# Patient Record
Sex: Female | Born: 1960 | Race: White | Hispanic: No | Marital: Married | Smoking: Never smoker
Health system: Southern US, Community
[De-identification: ages and names within clinical notes are randomized; demographics above are authoritative.]

## PROBLEM LIST (undated history)

## (undated) DIAGNOSIS — F419 Anxiety disorder, unspecified: Secondary | ICD-10-CM

## (undated) DIAGNOSIS — E039 Hypothyroidism, unspecified: Secondary | ICD-10-CM

## (undated) DIAGNOSIS — J45909 Unspecified asthma, uncomplicated: Secondary | ICD-10-CM

## (undated) DIAGNOSIS — M1990 Unspecified osteoarthritis, unspecified site: Secondary | ICD-10-CM

## (undated) DIAGNOSIS — D649 Anemia, unspecified: Secondary | ICD-10-CM

## (undated) DIAGNOSIS — T7840XA Allergy, unspecified, initial encounter: Secondary | ICD-10-CM

## (undated) DIAGNOSIS — I10 Essential (primary) hypertension: Secondary | ICD-10-CM

## (undated) HISTORY — DX: Unspecified osteoarthritis, unspecified site: M19.90

## (undated) HISTORY — DX: Anxiety disorder, unspecified: F41.9

## (undated) HISTORY — DX: Allergy, unspecified, initial encounter: T78.40XA

## (undated) HISTORY — DX: Essential (primary) hypertension: I10

## (undated) HISTORY — PX: BREAST BIOPSY: SHX20

## (undated) HISTORY — PX: NASAL SINUS SURGERY: SHX719

## (undated) HISTORY — PX: ANTERIOR CRUCIATE LIGAMENT REPAIR: SHX115

## (undated) HISTORY — PX: BUNIONECTOMY: SHX129

## (undated) HISTORY — DX: Unspecified asthma, uncomplicated: J45.909

## (undated) HISTORY — DX: Hypothyroidism, unspecified: E03.9

## (undated) HISTORY — PX: MENISCUS REPAIR: SHX5179

## (undated) HISTORY — DX: Anemia, unspecified: D64.9

## (undated) HISTORY — PX: TOTAL VAGINAL HYSTERECTOMY: SHX2548

---

## 1999-01-28 ENCOUNTER — Ambulatory Visit (HOSPITAL_COMMUNITY): Admission: RE | Admit: 1999-01-28 | Discharge: 1999-01-28 | Payer: Self-pay | Admitting: *Deleted

## 1999-02-05 ENCOUNTER — Ambulatory Visit (HOSPITAL_COMMUNITY): Admission: RE | Admit: 1999-02-05 | Discharge: 1999-02-05 | Payer: Self-pay | Admitting: *Deleted

## 1999-02-05 ENCOUNTER — Encounter: Payer: Self-pay | Admitting: *Deleted

## 2001-01-20 ENCOUNTER — Other Ambulatory Visit: Admission: RE | Admit: 2001-01-20 | Discharge: 2001-01-20 | Payer: Self-pay | Admitting: *Deleted

## 2004-10-06 ENCOUNTER — Ambulatory Visit: Payer: Self-pay

## 2005-01-14 ENCOUNTER — Ambulatory Visit: Payer: Self-pay | Admitting: Internal Medicine

## 2005-03-01 ENCOUNTER — Ambulatory Visit: Payer: Self-pay | Admitting: Unknown Physician Specialty

## 2005-09-21 ENCOUNTER — Ambulatory Visit: Payer: Self-pay

## 2006-03-30 ENCOUNTER — Ambulatory Visit: Payer: Self-pay | Admitting: Otolaryngology

## 2006-12-27 ENCOUNTER — Ambulatory Visit: Payer: Self-pay | Admitting: Internal Medicine

## 2007-07-04 ENCOUNTER — Ambulatory Visit: Payer: Self-pay | Admitting: Unknown Physician Specialty

## 2007-12-15 ENCOUNTER — Ambulatory Visit: Payer: Self-pay | Admitting: Gastroenterology

## 2007-12-28 ENCOUNTER — Ambulatory Visit: Payer: Self-pay | Admitting: Gastroenterology

## 2008-01-25 ENCOUNTER — Ambulatory Visit: Payer: Self-pay | Admitting: Unknown Physician Specialty

## 2008-04-04 ENCOUNTER — Ambulatory Visit: Payer: Self-pay | Admitting: Internal Medicine

## 2008-05-29 ENCOUNTER — Ambulatory Visit: Payer: Self-pay | Admitting: Unknown Physician Specialty

## 2008-06-11 ENCOUNTER — Inpatient Hospital Stay: Payer: Self-pay | Admitting: Unknown Physician Specialty

## 2008-08-05 ENCOUNTER — Encounter: Payer: Self-pay | Admitting: Maternal & Fetal Medicine

## 2008-08-06 ENCOUNTER — Encounter: Payer: Self-pay | Admitting: Maternal & Fetal Medicine

## 2009-12-14 ENCOUNTER — Emergency Department: Payer: Self-pay | Admitting: Emergency Medicine

## 2010-04-02 ENCOUNTER — Ambulatory Visit: Payer: Self-pay | Admitting: Unknown Physician Specialty

## 2011-10-14 ENCOUNTER — Ambulatory Visit: Payer: Self-pay | Admitting: Unknown Physician Specialty

## 2012-10-18 ENCOUNTER — Ambulatory Visit: Payer: Self-pay | Admitting: Unknown Physician Specialty

## 2012-12-18 ENCOUNTER — Encounter: Payer: Self-pay | Admitting: Gastroenterology

## 2013-10-19 ENCOUNTER — Ambulatory Visit: Payer: Self-pay | Admitting: Unknown Physician Specialty

## 2014-03-25 ENCOUNTER — Ambulatory Visit: Payer: Self-pay | Admitting: Cardiovascular Disease

## 2014-03-25 ENCOUNTER — Encounter (INDEPENDENT_AMBULATORY_CARE_PROVIDER_SITE_OTHER): Payer: Self-pay

## 2014-03-25 ENCOUNTER — Encounter: Payer: Self-pay | Admitting: Cardiovascular Disease

## 2014-03-25 ENCOUNTER — Ambulatory Visit (INDEPENDENT_AMBULATORY_CARE_PROVIDER_SITE_OTHER): Payer: No Typology Code available for payment source | Admitting: Cardiovascular Disease

## 2014-03-25 ENCOUNTER — Telehealth: Payer: Self-pay | Admitting: *Deleted

## 2014-03-25 VITALS — BP 138/84 | HR 70 | Ht 63.0 in | Wt 137.5 lb

## 2014-03-25 DIAGNOSIS — H9201 Otalgia, right ear: Secondary | ICD-10-CM | POA: Insufficient documentation

## 2014-03-25 DIAGNOSIS — R079 Chest pain, unspecified: Secondary | ICD-10-CM

## 2014-03-25 DIAGNOSIS — M542 Cervicalgia: Secondary | ICD-10-CM

## 2014-03-25 DIAGNOSIS — H9209 Otalgia, unspecified ear: Secondary | ICD-10-CM

## 2014-03-25 LAB — TROPONIN I

## 2014-03-25 LAB — D-DIMER(ARMC): D-Dimer: 363 ng/ml

## 2014-03-25 LAB — SEDIMENTATION RATE: ERYTHROCYTE SED RATE: 4 mm/h (ref 0–30)

## 2014-03-25 NOTE — Assessment & Plan Note (Signed)
The chest pain is overall atypical and likely musculoskeletal. However, there is a pleuritic component. The features are not anginal.  Physical exam is normal and baseline ECG does not show any acute changes. I requested labs today that include troponin level, D. dimer and sedimentation rate to exclude other causes of pleuritic chest pain. I also requested a chest x-ray. If these come back unremarkable, I don't think further workup is needed unless symptoms persist.

## 2014-03-25 NOTE — Assessment & Plan Note (Signed)
I don't think this is related to the chest pain. This has been associated with dizziness. I advised her to followup with ENT.

## 2014-03-25 NOTE — Patient Instructions (Signed)
Your physician recommends that you return for lab work in:  Please go straight to [facility] following your visit for STAT labs:  Troponin  Sed Rate  D Dimer   Please have a chest x ray as well   Your physician recommends that you schedule a follow-up appointment in:  As needed

## 2014-03-25 NOTE — Telephone Encounter (Signed)
Called patient to inform her that per Dr. Kirke CorinArida her labs and CXR are normal  Patient verbalized understanding

## 2014-03-25 NOTE — Progress Notes (Signed)
HPI   This is a very pleasant 53 year old female who is here for evaluation of chest pain. She has no previous cardiac history and no significant chronic medical conditions. She has known history of hypothyroidism on replacement therapy and previous knee surgery. She is a lifelong nonsmoker. Father had myocardial infarction at the age of 51 but otherwise there is no family history of premature coronary artery disease. Yesterday, she had an intense episode of lower substernal chest pain radiating to the back. It was sharp in nature and lasted for a few minutes. It happened after she was carrying some books and then sat down. The chest pain got worse with breathing. There was no heartburn or pressure feeling. No palpitations, syncope or presyncope. The chest continued to be sore but very mildly after that. She used a lidocaine patch and ibuprofen with significant improvement in symptoms. She has been having right-sided neck and ear pain recently but that was not associated with the chest pain. She also noticed recent dizziness and balance problems. She reports being under stress lately.  No Known Allergies   No current outpatient prescriptions on file prior to visit.   No current facility-administered medications on file prior to visit.     Past Medical History  Diagnosis Date  . Thyroid disease   . Hypothyroidism   . Hypertension   . Asthma      Past Surgical History  Procedure Laterality Date  . Total vaginal hysterectomy    . Knee surgery Left   . Bunionectomy    . Nasal sinus surgery       Family History  Problem Relation Age of Onset  . Colon cancer Mother   . Breast cancer Mother   . Heart attack Father   . Arrhythmia Father      History   Social History  . Marital Status: Married    Spouse Name: N/A    Number of Children: N/A  . Years of Education: N/A   Occupational History  . Not on file.   Social History Main Topics  . Smoking status: Never Smoker   .  Smokeless tobacco: Not on file  . Alcohol Use: Yes     Comment: socially  . Drug Use: No  . Sexual Activity: Not on file   Other Topics Concern  . Not on file   Social History Narrative  . No narrative on file     ROS A 10 point review of system was performed. It is negative other than that mentioned in the history of present illness.   PHYSICAL EXAM   BP 138/84  Pulse 70  Ht 5\' 3"  (1.6 m)  Wt 137 lb 8 oz (62.37 kg)  BMI 24.36 kg/m2 Constitutional: She is oriented to person, place, and time. She appears well-developed and well-nourished. No distress.  HENT: No nasal discharge.  Head: Normocephalic and atraumatic.  Eyes: Pupils are equal and round. No discharge.  Neck: Normal range of motion. Neck supple. No JVD present. No thyromegaly present.  Cardiovascular: Normal rate, regular rhythm, normal heart sounds. Exam reveals no gallop and no friction rub. No murmur heard.  Pulmonary/Chest: Effort normal and breath sounds normal. No stridor. No respiratory distress. She has no wheezes. She has no rales. She exhibits no tenderness.  Abdominal: Soft. Bowel sounds are normal. She exhibits no distension. There is no tenderness. There is no rebound and no guarding.  Musculoskeletal: Normal range of motion. She exhibits no edema and no tenderness.  Neurological:  She is alert and oriented to person, place, and time. Coordination normal.  Skin: Skin is warm and dry. No rash noted. She is not diaphoretic. No erythema. No pallor.  Psychiatric: She has a normal mood and affect. Her behavior is normal. Judgment and thought content normal.     EKG: Normal sinus rhythm with no significant ST or T wave changes.   ASSESSMENT AND PLAN

## 2014-03-26 ENCOUNTER — Encounter: Payer: Self-pay | Admitting: Cardiovascular Disease

## 2014-03-26 ENCOUNTER — Other Ambulatory Visit: Payer: Self-pay

## 2014-03-26 DIAGNOSIS — R079 Chest pain, unspecified: Secondary | ICD-10-CM

## 2015-02-03 ENCOUNTER — Ambulatory Visit: Payer: Self-pay | Admitting: Family Medicine

## 2015-04-09 ENCOUNTER — Ambulatory Visit (INDEPENDENT_AMBULATORY_CARE_PROVIDER_SITE_OTHER): Payer: No Typology Code available for payment source | Admitting: Podiatry

## 2015-04-09 ENCOUNTER — Encounter: Payer: Self-pay | Admitting: Podiatry

## 2015-04-09 VITALS — BP 138/85 | HR 68 | Resp 16 | Ht 63.0 in | Wt 132.0 lb

## 2015-04-09 DIAGNOSIS — Q828 Other specified congenital malformations of skin: Secondary | ICD-10-CM | POA: Diagnosis not present

## 2015-04-09 DIAGNOSIS — B079 Viral wart, unspecified: Secondary | ICD-10-CM

## 2015-04-09 MED ORDER — FLUOROURACIL 5 % EX CREA: TOPICAL_CREAM | Freq: Two times a day (BID) | CUTANEOUS | Status: DC

## 2015-04-09 NOTE — Progress Notes (Signed)
   Subjective:    Patient ID: Katherine Braun, female    DOB: 1961/01/29, 54 y.o.   MRN: [id number]  HPI  Painful lesion on the bottom of the right foot, real painful when walking . 2nd met   Review of Systems  All other systems reviewed and are negative.      Objective:   Physical Exam: I have reviewed her past mental history medications allergies surgery social history and review of systems. Pulses are palpable bilateral. Hallux valgus is present bilateral. Plantar flexed elongated second metatarsal resulting in a ridge of porokeratotic lesions to the plantar medial aspect second metatarsal head right foot. 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 Verruca plantaris sub-first metatarsophalangeal joint right foot.  Plan: Debridement of reactive hyperkeratoses today. Placed padding. I also wrote a prescription for Efudex cream to be applied twice daily under occlusion and I will follow-up with her in 6 weeks.

## 2015-05-21 ENCOUNTER — Ambulatory Visit: Payer: No Typology Code available for payment source | Admitting: Podiatry

## 2015-11-11 ENCOUNTER — Encounter: Payer: Self-pay | Admitting: Gastroenterology

## 2017-06-28 ENCOUNTER — Other Ambulatory Visit: Payer: Self-pay | Admitting: Family Medicine

## 2017-06-28 DIAGNOSIS — Z1231 Encounter for screening mammogram for malignant neoplasm of breast: Secondary | ICD-10-CM

## 2017-07-11 ENCOUNTER — Encounter (HOSPITAL_COMMUNITY): Payer: Self-pay

## 2017-07-11 ENCOUNTER — Ambulatory Visit
Admission: RE | Admit: 2017-07-11 | Discharge: 2017-07-11 | Disposition: A | Discharge status: Home / Self Care | Payer: 59 | Source: Ambulatory Visit | Attending: Family Medicine | Admitting: Family Medicine

## 2017-07-11 DIAGNOSIS — Z1231 Encounter for screening mammogram for malignant neoplasm of breast: Secondary | ICD-10-CM | POA: Diagnosis present

## 2017-09-14 ENCOUNTER — Other Ambulatory Visit: Payer: Self-pay | Admitting: Unknown Physician Specialty

## 2017-09-14 ENCOUNTER — Ambulatory Visit
Admission: RE | Admit: 2017-09-14 | Discharge: 2017-09-14 | Disposition: A | Discharge status: Home / Self Care | Payer: 59 | Source: Ambulatory Visit | Attending: Unknown Physician Specialty | Admitting: Unknown Physician Specialty

## 2017-09-14 DIAGNOSIS — R140 Abdominal distension (gaseous): Secondary | ICD-10-CM

## 2017-09-14 DIAGNOSIS — R142 Eructation: Secondary | ICD-10-CM

## 2017-09-14 DIAGNOSIS — R1013 Epigastric pain: Secondary | ICD-10-CM

## 2017-09-14 DIAGNOSIS — R110 Nausea: Secondary | ICD-10-CM

## 2017-09-14 DIAGNOSIS — R1010 Upper abdominal pain, unspecified: Secondary | ICD-10-CM | POA: Insufficient documentation

## 2018-01-11 ENCOUNTER — Encounter: Payer: Self-pay | Admitting: Gastroenterology

## 2018-02-24 ENCOUNTER — Other Ambulatory Visit: Payer: Self-pay

## 2018-02-24 DIAGNOSIS — R079 Chest pain, unspecified: Secondary | ICD-10-CM

## 2018-03-20 ENCOUNTER — Ambulatory Visit (INDEPENDENT_AMBULATORY_CARE_PROVIDER_SITE_OTHER)
Admission: RE | Admit: 2018-03-20 | Discharge: 2018-03-20 | Disposition: A | Discharge status: Home / Self Care | Payer: BLUE CROSS/BLUE SHIELD | Source: Ambulatory Visit | Attending: Cardiovascular Disease | Admitting: Cardiovascular Disease

## 2018-03-20 DIAGNOSIS — R079 Chest pain, unspecified: Secondary | ICD-10-CM

## 2018-07-31 ENCOUNTER — Other Ambulatory Visit: Payer: Self-pay | Admitting: Family Medicine

## 2018-07-31 ENCOUNTER — Encounter: Payer: Self-pay | Admitting: Gastroenterology

## 2018-07-31 DIAGNOSIS — Z1231 Encounter for screening mammogram for malignant neoplasm of breast: Secondary | ICD-10-CM

## 2018-08-15 ENCOUNTER — Ambulatory Visit
Admission: RE | Admit: 2018-08-15 | Discharge: 2018-08-15 | Disposition: A | Discharge status: Home / Self Care | Payer: BLUE CROSS/BLUE SHIELD | Source: Ambulatory Visit | Attending: Family Medicine | Admitting: Family Medicine

## 2018-08-15 DIAGNOSIS — Z1231 Encounter for screening mammogram for malignant neoplasm of breast: Secondary | ICD-10-CM | POA: Diagnosis not present

## 2018-08-21 ENCOUNTER — Other Ambulatory Visit: Payer: Self-pay | Admitting: Family Medicine

## 2018-08-21 DIAGNOSIS — R928 Other abnormal and inconclusive findings on diagnostic imaging of breast: Secondary | ICD-10-CM

## 2018-08-21 DIAGNOSIS — N6489 Other specified disorders of breast: Secondary | ICD-10-CM

## 2018-08-25 ENCOUNTER — Ambulatory Visit
Admission: RE | Admit: 2018-08-25 | Discharge: 2018-08-25 | Disposition: A | Discharge status: Home / Self Care | Payer: BLUE CROSS/BLUE SHIELD | Source: Ambulatory Visit | Attending: Family Medicine | Admitting: Family Medicine

## 2018-08-25 DIAGNOSIS — N6489 Other specified disorders of breast: Secondary | ICD-10-CM

## 2018-08-25 DIAGNOSIS — R928 Other abnormal and inconclusive findings on diagnostic imaging of breast: Secondary | ICD-10-CM | POA: Diagnosis present

## 2018-09-15 ENCOUNTER — Ambulatory Visit (INDEPENDENT_AMBULATORY_CARE_PROVIDER_SITE_OTHER): Payer: BLUE CROSS/BLUE SHIELD | Admitting: Gastroenterology

## 2018-09-15 ENCOUNTER — Encounter (INDEPENDENT_AMBULATORY_CARE_PROVIDER_SITE_OTHER): Payer: Self-pay

## 2018-09-15 ENCOUNTER — Encounter: Payer: Self-pay | Admitting: Gastroenterology

## 2018-09-15 VITALS — BP 116/82 | HR 80 | Ht 63.0 in | Wt 141.0 lb

## 2018-09-15 DIAGNOSIS — R194 Change in bowel habit: Secondary | ICD-10-CM

## 2018-09-15 DIAGNOSIS — R1032 Left lower quadrant pain: Secondary | ICD-10-CM | POA: Diagnosis not present

## 2018-09-15 DIAGNOSIS — Z800 Family history of malignant neoplasm of digestive organs: Secondary | ICD-10-CM | POA: Diagnosis not present

## 2018-09-15 MED ORDER — PEG-KCL-NACL-NASULF-NA ASC-C 140 G PO SOLR: 140.0000 g | ORAL | 0 refills | Status: DC

## 2018-09-15 MED ORDER — METOCLOPRAMIDE HCL 5 MG PO TABS: ORAL_TABLET | ORAL | 0 refills | Status: DC

## 2018-09-15 NOTE — Progress Notes (Signed)
[facility] Consult Note:  History: Katherine Braun 09/15/2018  Referring physician: Kandyce Rud, MD  Reason for consult/chief complaint: Diarrhea (started this summer ) and Abdominal Pain (lower abd pain right before a BM)   Subjective  HPI:  This is a very pleasant 57 year old woman referred by primary care for chronic diarrhea.  It started during a trip to [place] in the spring, and she saw primary care for it on 05/16/2018.  There was a plan for stool study, but [name] says that when she started the dicyclomine they prescribed, the diarrhea abated so she did not do the stool studies.  She was also having severe anxiety at the time which has lately been better on Effexor.  She had previously been taking magnesium for chronic constipation but has long since stopped that.  Occasionally she will have a sharp left lower quadrant pain before bowel movement in the stools were watery at times.  That has all abated in the last couple of months and now she will vary between constipation, normal stool and occasional loose or soft stool. She denies nocturnal diarrhea, and had 1 or 2 episodes where she was not able to get to the bathroom on time when there was urgency.  That has not occurred for the last couple of months and she still takes the dicyclomine as needed. She has a family history of colon cancer in her mother and paternal grandfather, and also an aunt has had colon cancer and breast cancer [name] her last had a colonoscopy with his clinic in January 2009, and it was only notable for diverticulosis.   ROS:  Review of Systems  Constitutional: Negative for appetite change and unexpected weight change.  HENT: Negative for mouth sores and voice change.   Eyes: Negative for pain and redness.  Respiratory: Negative for cough and shortness of breath.   Cardiovascular: Negative for chest pain and palpitations.  Genitourinary: Negative for dysuria and hematuria.    Musculoskeletal: Positive for arthralgias and back pain. Negative for myalgias.  Skin: Negative for pallor and rash.  Neurological: Negative for weakness and headaches.  Hematological: Negative for adenopathy.  Psychiatric/Behavioral:       Chronic anxiety     Past Medical History: Past Medical History:  Diagnosis Date  . Anemia   . Anxiety   . Arthritis   . Asthma   . Hypertension   . Hypothyroidism      Past Surgical History: Past Surgical History:  Procedure Laterality Date  . ANTERIOR CRUCIATE LIGAMENT REPAIR Left   . BUNIONECTOMY Left   . MENISCUS REPAIR Left   . NASAL SINUS SURGERY    . TOTAL VAGINAL HYSTERECTOMY       Family History: Family History  Problem Relation Age of Onset  . Colon cancer Mother   . Breast cancer Mother 30  . Diabetes Mother   . Kidney disease Mother   . Heart attack Father   . Arrhythmia Father   . Colon polyps Father   . Diverticulitis Father   . Colon cancer Paternal Grandfather   . Breast cancer Maternal Aunt   . Colon cancer Maternal Aunt   . Lung cancer Maternal Aunt     Social History: Social History   Socioeconomic History  . Marital status: Married    Spouse name: Not on file  . Number of children: 2  . Years of education: Not on file  . Highest education level: Not on file  Occupational History  .  Occupation: homemaker  Social Needs  . Financial resource strain: Not on file  . Food insecurity:    Worry: Not on file    Inability: Not on file  . Transportation needs:    Medical: Not on file    Non-medical: Not on file  Tobacco Use  . Smoking status: Never Smoker  . Smokeless tobacco: Never Used  Substance and Sexual Activity  . Alcohol use: Yes    Alcohol/week: 0.0 standard drinks    Comment: socially  . Drug use: No  . Sexual activity: Not on file  Lifestyle  . Physical activity:    Days per week: Not on file    Minutes per session: Not on file  . Stress: Not on file  Relationships  . Social  connections:    Talks on phone: Not on file    Gets together: Not on file    Attends religious service: Not on file    Active member of club or organization: Not on file    Attends meetings of clubs or organizations: Not on file    Relationship status: Not on file  Other Topics Concern  . Not on file  Social History Narrative  . Not on file    Allergies: Allergies  Allergen Reactions  . Tylenol [Acetaminophen]     Flu like sx's     Outpatient Meds: Current Outpatient Medications  Medication Sig Dispense Refill  . B Complex Vitamins (VITAMIN-B COMPLEX PO) Take by mouth daily.    . Cholecalciferol (VITAMIN D-3 PO) Take by mouth daily.    Marland Kitchen DEVILS CLAW PO Take 1 tablet by mouth daily.    Marland Kitchen dicyclomine (BENTYL) 20 MG tablet Take 20 mg by mouth as needed for spasms.    Marland Kitchen ELDERBERRY PO Take 2 tablets by mouth daily.    . Glucosamine HCl (GLUCOSAMINE PO) Take by mouth daily.    Marland Kitchen MAGNESIUM PO Take by mouth daily.    . Melatonin 5 MG CAPS Take 2 capsules by mouth at bedtime as needed.    . Multiple Vitamins-Minerals (HAIR VITAMINS PO) Take 1 tablet by mouth daily.    . multivitamin-lutein (OCUVITE-LUTEIN) CAPS capsule Take 1 capsule by mouth daily.    . Omega-3 Fatty Acids (FISH OIL PO) Take by mouth 2 (two) times daily.    Marland Kitchen thyroid (ARMOUR) 60 MG tablet Take 180 mg by mouth as directed.    . TURMERIC PO Take 1 tablet by mouth as needed.    . venlafaxine XR (EFFEXOR-XR) 37.5 MG 24 hr capsule Take 37.5 mg by mouth daily with breakfast.    . metoCLOPramide (REGLAN) 5 MG tablet Take 1 tablet one hour prior to drinking Plenvu bowel prep 2 tablet 0  . PEG-KCl-NaCl-NaSulf-Na Asc-C (PLENVU) 140 g SOLR Take 140 g by mouth as directed. 1 each 0   No current facility-administered medications for this visit.       ___________________________________________________________________ Objective   Exam:  BP 116/82 (BP Location: Left Arm, Patient Position: Sitting, Cuff Size: Normal)    Pulse 80   Ht 5\' 3"  (1.6 m) Comment: height measured without shoes  Wt 141 lb (64 kg)   BMI 24.98 kg/m    General: this is a(n) well-appearing woman  Eyes: sclera anicteric, no redness  ENT: oral mucosa moist without lesions, no cervical or supraclavicular lymphadenopathy, good dentition  CV: RRR without murmur, S1/S2, no JVD, no peripheral edema  Resp: clear to auscultation bilaterally, normal RR and effort noted  GI:  soft, no tenderness, with active bowel sounds. No guarding or palpable organomegaly noted.  Skin; warm and dry, no rash or jaundice noted  Neuro: awake, alert and oriented x 3. Normal gross motor function and fluent speech  Labs:  nml LFTS/TSH, Albumin June 2019  Radiologic Studies:  None  Assessment: Encounter Diagnoses  Name Primary?  . Change in bowel habit Yes  . Family history of colon cancer   . LLQ abdominal pain     She seems to have developed IBS-like symptoms in the setting of worsened chronic anxiety.  IBD or microscopic colitis or less likely considerations.  Doubtful new onset celiac. She is due for a colonoscopy for family history of colon cancer.  Plan:  Screening colonoscopy due to family history  Biopsies can be taken at that time to rule out microscopic colitis Continue as needed use of dicyclomine  Thank you for the courtesy of this consult.  Please call me with any questions or concerns.  Charlie Pitter III  CC: Kandyce Rud, MD

## 2018-09-15 NOTE — Patient Instructions (Signed)
If you are age 55 or older, your body mass index should be between 23-30. Your Body mass index is 24.98 kg/m. If this is out of the aforementioned range listed, please consider follow up with your Primary Care Provider.  If you are age 87 or younger, your body mass index should be between 19-25. Your Body mass index is 24.98 kg/m. If this is out of the aformentioned range listed, please consider follow up with your Primary Care Provider.   You have been scheduled for a colonoscopy. Please follow written instructions given to you at your visit today.  Please pick up your prep supplies at the pharmacy within the next 1-3 days. If you use inhalers (even only as needed), please bring them with you on the day of your procedure. Your physician has requested that you go to www.startemmi.com and enter the access code given to you at your visit today. This web site gives a general overview about your procedure. However, you should still follow specific instructions given to you by our office regarding your preparation for the procedure.  It was a pleasure to see you today!  Dr. Myrtie Neither

## 2018-09-21 ENCOUNTER — Encounter: Payer: Self-pay | Admitting: Gastroenterology

## 2018-09-25 ENCOUNTER — Encounter: Payer: Self-pay | Admitting: Gastroenterology

## 2018-10-03 ENCOUNTER — Encounter: Payer: Self-pay | Admitting: Gastroenterology

## 2018-10-03 ENCOUNTER — Ambulatory Visit (AMBULATORY_SURGERY_CENTER): Payer: BLUE CROSS/BLUE SHIELD | Admitting: Gastroenterology

## 2018-10-03 VITALS — BP 133/83 | HR 58 | Temp 97.7°F | Resp 16 | Ht 63.0 in | Wt 141.0 lb

## 2018-10-03 DIAGNOSIS — Z1211 Encounter for screening for malignant neoplasm of colon: Secondary | ICD-10-CM

## 2018-10-03 DIAGNOSIS — K5730 Diverticulosis of large intestine without perforation or abscess without bleeding: Secondary | ICD-10-CM | POA: Diagnosis not present

## 2018-10-03 DIAGNOSIS — K529 Noninfective gastroenteritis and colitis, unspecified: Secondary | ICD-10-CM

## 2018-10-03 DIAGNOSIS — Z800 Family history of malignant neoplasm of digestive organs: Secondary | ICD-10-CM

## 2018-10-03 MED ORDER — SODIUM CHLORIDE 0.9 % IV SOLN: 500.0000 mL | Freq: Once | INTRAVENOUS | Status: DC

## 2018-10-03 NOTE — Progress Notes (Signed)
Called to room to assist during endoscopic procedure.  Patient ID and intended procedure confirmed with present staff. Received instructions for my participation in the procedure from the performing physician.  

## 2018-10-03 NOTE — Patient Instructions (Signed)
YOU HAD AN ENDOSCOPIC PROCEDURE TODAY AT THE [facility]:   Refer to the procedure report that was given to you for any specific questions about what was found during the examination.  If the procedure report does not answer your questions, please call your gastroenterologist to clarify.  If you requested that your care partner not be given the details of your procedure findings, then the procedure report has been included in a sealed envelope for you to review at your convenience later.  YOU SHOULD EXPECT: Some feelings of bloating in the abdomen. Passage of more gas than usual.  Walking can help get rid of the air that was put into your GI tract during the procedure and reduce the bloating. If you had a lower endoscopy (such as a colonoscopy or flexible sigmoidoscopy) you may notice spotting of blood in your stool or on the toilet paper. If you underwent a bowel prep for your procedure, you may not have a normal bowel movement for a few days.  Please Note:  You might notice some irritation and congestion in your nose or some drainage.  This is from the oxygen used during your procedure.  There is no need for concern and it should clear up in a day or so.  SYMPTOMS TO REPORT IMMEDIATELY:   Following lower endoscopy (colonoscopy or flexible sigmoidoscopy):  Excessive amounts of blood in the stool  Significant tenderness or worsening of abdominal pains  Swelling of the abdomen that is new, acute  Fever of 100F or higher  Please see handouts given to you on Diverticulosis.  For urgent or emergent issues, a gastroenterologist can be reached at any hour by calling [phone].   DIET:  We do recommend a small meal at first, but then you may proceed to your regular diet.  Drink plenty of fluids but you should avoid alcoholic beverages for 24 hours.  ACTIVITY:  You should plan to take it easy for the rest of today and you should NOT DRIVE or use heavy machinery until tomorrow  (because of the sedation medicines used during the test).    FOLLOW UP: Our staff will call the number listed on your records the next business day following your procedure to check on you and address any questions or concerns that you may have regarding the information given to you following your procedure. If we do not reach you, we will leave a message.  However, if you are feeling well and you are not experiencing any problems, there is no need to return our call.  We will assume that you have returned to your regular daily activities without incident.  If any biopsies were taken you will be contacted by phone or by letter within the next 1-3 weeks.  Please call us at [phone] if you have not heard about the biopsies in 3 weeks.    SIGNATURES/CONFIDENTIALITY: You and/or your care partner have signed paperwork which will be entered into your electronic medical record.  These signatures attest to the fact that that the information above on your After Visit Summary has been reviewed and is understood.  Full responsibility of the confidentiality of this discharge information lies with you and/or your care-partner.  Thank you for letting us take care of your healthcare needs today. 

## 2018-10-03 NOTE — Op Note (Signed)
[facility] Patient Name: Katherine Braun Procedure Date: 10/03/2018 9:37 AM MRN: [id number] Endoscopist: Sherilyn Cooter L. Myrtie Neither , MD Age: 57 Referring MD:  Date of Birth: [birth year] Gender: Female Account #: [id number] Procedure:                Colonoscopy Indications:              Colon cancer screening in patient at increased                            risk: Colorectal cancer in mother (also maternal                            aunt and paternal grandfather) Medicines:                Monitored Anesthesia Care Procedure:                Pre-Anesthesia Assessment:                           - Prior to the procedure, a History and Physical                            was performed, and patient medications and                            allergies were reviewed. The patient's tolerance of                            previous anesthesia was also reviewed. The risks                            and benefits of the procedure and the sedation                            options and risks were discussed with the patient.                            All questions were answered, and informed consent                            was obtained. Prior Anticoagulants: The patient has                            taken no previous anticoagulant or antiplatelet                            agents. ASA Grade Assessment: II - A patient with                            mild systemic disease. After reviewing the risks                            and benefits, the patient was deemed in  satisfactory condition to undergo the procedure.                           After obtaining informed consent, the colonoscope                            was passed under direct vision. Throughout the                            procedure, the patient's blood pressure, pulse, and                            oxygen saturations were monitored continuously. The                            Colonoscope was  introduced through the anus and                            advanced to the the cecum, identified by                            appendiceal orifice and ileocecal valve. The                            colonoscopy was performed without difficulty. The                            patient tolerated the procedure well. The quality                            of the bowel preparation was excellent. The                            ileocecal valve, appendiceal orifice, and rectum                            were photographed. The quality of the bowel                            preparation was evaluated using the BBPS [facility]                            Bowel Preparation Scale) with scores of: Right                            Colon = 3, Transverse Colon = 3 and Left Colon = 3                            (entire mucosa seen well with no residual staining,                            small fragments of stool or opaque liquid). The  total BBPS score equals 9. The bowel preparation                            used was Plenvu. Scope In: 9:49:42 AM Scope Out: 10:02:47 AM Scope Withdrawal Time: 0 hours 9 minutes 7 seconds  Total Procedure Duration: 0 hours 13 minutes 5 seconds  Findings:                 The perianal and digital rectal examinations were                            normal.                           Diverticula were found from the transverse colon to                            the sigmoid colon.                           Normal mucosa was found in the entire colon.                            Biopsies for histology were taken with a cold                            forceps from the right colon and left colon for                            evaluation of microscopic colitis due to reported                            chronic diarrhea.                           There is no endoscopic evidence of polyps in the                            entire colon.                           The  exam was otherwise without abnormality on                            direct and retroflexion views. Complications:            No immediate complications. Estimated Blood Loss:     Estimated blood loss was minimal. Impression:               - Diverticulosis in the left colon.                           - Normal mucosa in the entire examined colon.                            Biopsied.                           -  The examination was otherwise normal on direct                            and retroflexion views. Recommendation:           - Patient has a contact number available for                            emergencies. The signs and symptoms of potential                            delayed complications were discussed with the                            patient. Return to normal activities tomorrow.                            Written discharge instructions were provided to the                            patient.                           - Resume previous diet.                           - Continue present medications.                           - Await pathology results.                           - Repeat colonoscopy in 5 years for screening                            purposes. Dayshia Ballinas L. Myrtie Neither, MD 10/03/2018 10:08:57 AM This report has been signed electronically.

## 2018-10-03 NOTE — Progress Notes (Signed)
Spontaneous respirations throughout. VSS. Resting comfortably. To PACU on room air. Report to  RN. 

## 2018-10-04 ENCOUNTER — Telehealth: Payer: Self-pay

## 2018-10-04 NOTE — Telephone Encounter (Signed)
  Follow up Call-  Call back number 10/03/2018  Post procedure Call Back phone  # [phone]  Permission to leave phone message Yes  Some recent data might be hidden     Patient questions:  Do you have a fever, pain , or abdominal swelling? No. Pain Score  0 *  Have you tolerated food without any problems? Yes.    Have you been able to return to your normal activities? Yes.    Do you have any questions about your discharge instructions: Diet   No. Medications  No. Follow up visit  No.  Do you have questions or concerns about your Care? No.  Actions: * If pain score is 4 or above: No action needed, pain <4.

## 2018-10-11 ENCOUNTER — Encounter: Payer: BLUE CROSS/BLUE SHIELD | Admitting: Gastroenterology

## 2018-11-21 NOTE — Telephone Encounter (Signed)
Error

## 2019-08-20 ENCOUNTER — Encounter: Payer: Self-pay | Admitting: Occupational Therapy

## 2019-08-20 ENCOUNTER — Other Ambulatory Visit: Payer: Self-pay

## 2019-08-20 ENCOUNTER — Ambulatory Visit: Payer: BC Managed Care – PPO | Attending: Rheumatology | Admitting: Occupational Therapy

## 2019-08-20 DIAGNOSIS — M79642 Pain in left hand: Secondary | ICD-10-CM | POA: Insufficient documentation

## 2019-08-20 DIAGNOSIS — M79641 Pain in right hand: Secondary | ICD-10-CM | POA: Diagnosis not present

## 2019-08-20 NOTE — Patient Instructions (Signed)
Pt to do moist heat in the am for stiffness  Gentle tendon glides  Opposition  Pain free 10 reps Evening to decrease pain   Joint protection and AE hand outs review and provided

## 2019-08-20 NOTE — Therapy (Signed)
[facility] [address], [place], [address] Phone: [phone]   Fax:  [phone]  Occupational Therapy Evaluation  Patient Details  Name: Katherine Braun MRN: [id number] Date of Birth: 1961-09-21 Referring Provider (OT): Jefm Bryant   Encounter Date: 08/20/2019  OT End of Session - 08/20/19 1211    Visit Number  1    Number of Visits  4    Date for OT Re-Evaluation  09/17/19    OT Start Time  1050    OT Stop Time  1155    OT Time Calculation (min)  65 min    Activity Tolerance  Patient tolerated treatment well    Behavior During Therapy  [facility] for tasks assessed/performed       Past Medical History:  Diagnosis Date  . Allergy   . Anemia   . Anxiety   . Arthritis   . Asthma   . Hypertension   . Hypothyroidism     Past Surgical History:  Procedure Laterality Date  . ANTERIOR CRUCIATE LIGAMENT REPAIR Left   . BUNIONECTOMY Left   . MENISCUS REPAIR Left   . NASAL SINUS SURGERY    . TOTAL VAGINAL HYSTERECTOMY      There were no vitals filed for this visit.  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, golf , yardwork sewing, crochet,       Strength   Right Hand Grip (lbs)  50   pain 8/10 pain free 20   Right Hand Lateral Pinch  17 lbs   pain   Right Hand 3 Point Pinch  15 lbs   pain   Left Hand Grip (lbs)  63   pain 8/10 - pain free 25   Left Hand Lateral Pinch  15 lbs   pain   Left Hand 3 Point Pinch  16 lbs   pain     Right Hand AROM   R Thumb Radial ABduction/ADduction 0-55  48    R Thumb Palmar ABduction/ADduction 0-45  55    R Thumb Opposition to Index  --   pain to WNL   R Index  MCP 0-90  85 Degrees    R Index PIP 0-100  100 Degrees    R Long  MCP 0-90  90 Degrees    R Long PIP 0-100  100 Degrees    R Ring  MCP 0-90  90 Degrees    R Ring PIP 0-100  100 Degrees    R Little  MCP 0-90  90 Degrees    R Little PIP 0-100  100 Degrees      Left Hand AROM   L Thumb Radial ADduction/ABduction 0-55  45    L Thumb Palmar ADduction/ABduction 0-45  50    L Thumb Opposition to Index  --   Oppposition WNL but pain    L Index  MCP 0-90  90 Degrees    L Index PIP 0-100  100 Degrees    L Long  MCP 0-90  90 Degrees    L Long PIP 0-100  100 Degrees    L Ring  MCP 0-90  90 Degrees    L Ring PIP 0-100  100 Degrees    L Little  MCP 0-90  90 Degrees    L Little PIP 0-100  100 Degrees         Paraffin done to bilateral hands  Prior to review of home program to decrease pain   Fitted with bilateral CMC neoprene splints to use with activities to decrease pain   Pt to do moist heat in the am for stiffness  Gentle tendon glides  Opposition  Pain free 10 reps Evening to decrease  pain   Joint protection and AE hand outs review in length  and provided            OT Education - 08/20/19 1211    Education Details  Findings of eval and Homeprogram    Person(s) Educated  Patient    Methods  Explanation;Demonstration;Tactile cues;Verbal cues;Handout    Comprehension  Returned demonstration;Verbalized understanding       OT Short Term Goals - 08/20/19 1215      OT SHORT TERM GOAL #1   Title  Pt to be independent in homeprogram to modify activities to decrease pain less than 3/10    Baseline  pain increase to 5-8/10 , and not knowledge on homeprogram    Time  2    Period  Weeks    Status  New    Target Date  09/03/19        OT Long Term Goals - 08/20/19 1216      OT LONG TERM GOAL #1   Title  Pt to report 3 joint protectio and AE to decrease pain and increase ease with use on hands    Baseline  no knowledge    Time  4    Period  Weeks    Status  New    Target Date  09/17/19      OT LONG TERM GOAL #2   Title  Pain on PRWHE decrease by more than 20 points    Baseline  pain at eval on PRWHE 39/50    Time  4  Period  Weeks    Status  New    Target Date  09/17/19            Plan - 08/20/19 1212    Clinical Impression Statement  Pt refer to OT /hand therapy for hand pain - CMC thumb pain R worse than L , and 2nd and 3rd digits PIP's - pt pain 5-8/10 on the R hand and 3-5/10 on the L - pt very activie lifestyle and using her hands a lot - pt AROM WNL and strength pain increase to 8/10 but in range for her age - pt in need for modifications of tasks, joint protection and AE - to decrease pain and reduce stress on her hands- fitted with bilateral thumb CMC neoprene splints to use with activities to decrease pain - pt to follow up next week - goal to decrease pain to less than 3/10 with tasks this week    OT Occupational Profile and History  Problem Focused Assessment - Including review of records relating to presenting problem    Occupational  performance deficits (Please refer to evaluation for details):  ADL's;IADL's;Rest and Sleep;Play;Leisure;Social Participation    Body Structure / Function / Physical Skills  ADL;UE functional use;Decreased knowledge of use of DME;Dexterity;Pain;Strength;IADL    Rehab Potential  Fair    Clinical Decision Making  Limited treatment options, no task modification necessary    Comorbidities Affecting Occupational Performance:  None    Modification or Assistance to Complete Evaluation   No modification of tasks or assist necessary to complete eval    OT Frequency  1x / week    OT Duration  4 weeks    OT Treatment/Interventions  Self-care/ADL training;Therapeutic exercise;Patient/family education;Splinting;Paraffin;DME and/or AE instruction;Manual Therapy    Plan  assess progress with modifications and homeprogram    OT Home Exercise Plan  see pt instruction    Consulted and Agree with Plan of Care  Patient       Patient will benefit from skilled therapeutic intervention in order to improve the following deficits and impairments:   Body Structure / Function / Physical Skills: ADL, UE functional use, Decreased knowledge of use of DME, Dexterity, Pain, Strength, IADL       Visit Diagnosis: Pain in right hand - Plan: Ot plan of care cert/re-cert  Pain in left hand - Plan: Ot plan of care cert/re-cert    Problem List Patient Active Problem List   Diagnosis Date Noted  . Chest pain 03/25/2014  . Right ear pain 03/25/2014    Oletta Cohn  OTR/L,CLT 08/20/2019, 12:22 PM  [facility] [address] [place], [address] Phone: [phone]   Fax:  [phone]  Name: NEAVE SHAMES MRN: [id number] Date of Birth: 1960-12-22

## 2019-08-30 ENCOUNTER — Ambulatory Visit: Payer: BC Managed Care – PPO | Admitting: Occupational Therapy

## 2019-12-22 IMAGING — MG MM DIGITAL SCREENING BILAT W/ TOMO W/ CAD
6 of 11 series · 6 of 31 positions shown · non-contrast
Comparison: Previous exam(s).

CLINICAL DATA: Screening.

EXAM:
DIGITAL SCREENING BILATERAL MAMMOGRAM WITH TOMO AND CAD

[R CC synth-2D]
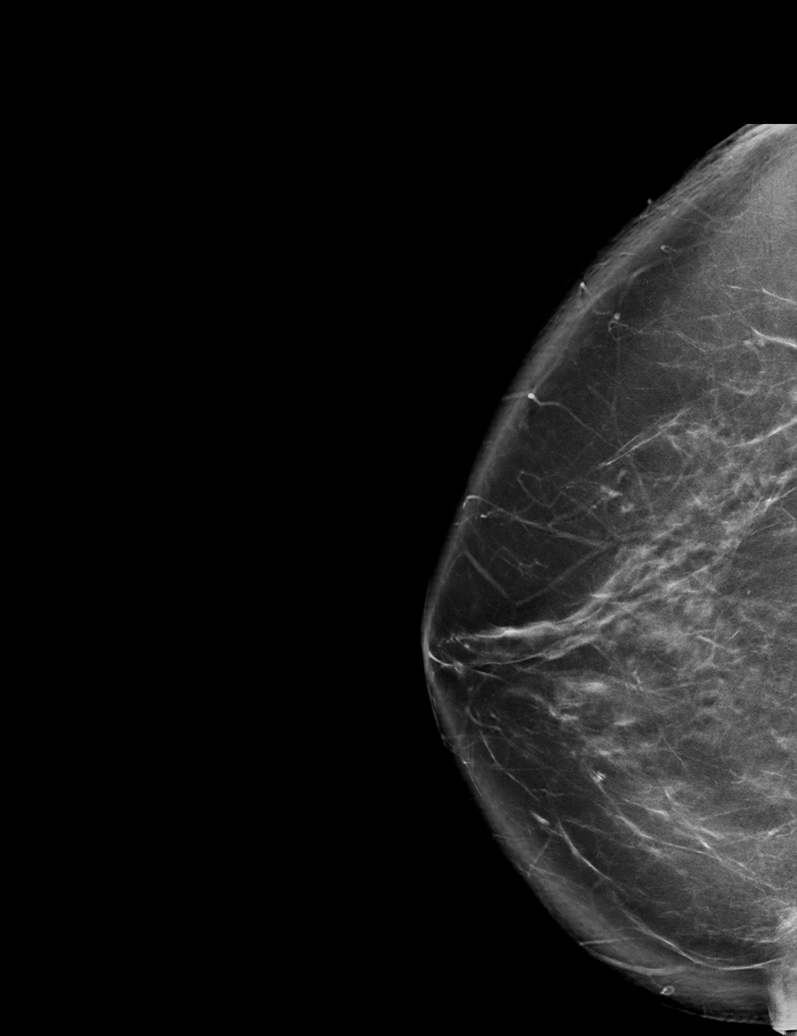

[R MLO synth-2D (1 of 2)]
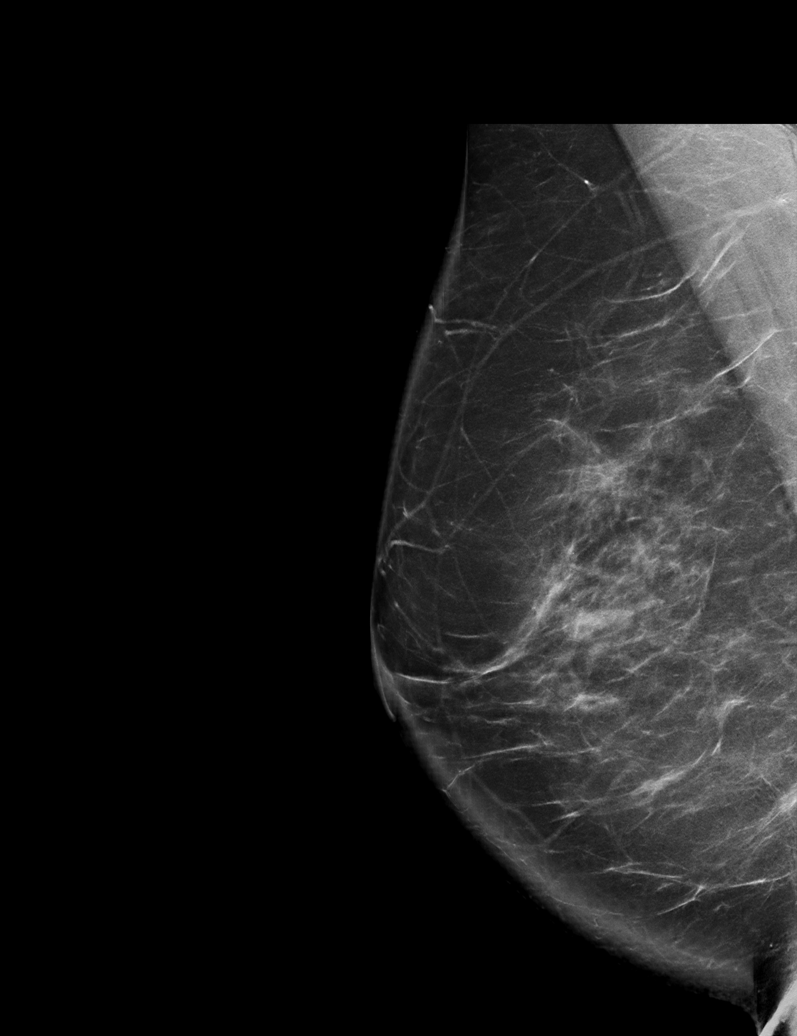

[R MLO synth-2D (2 of 2)]
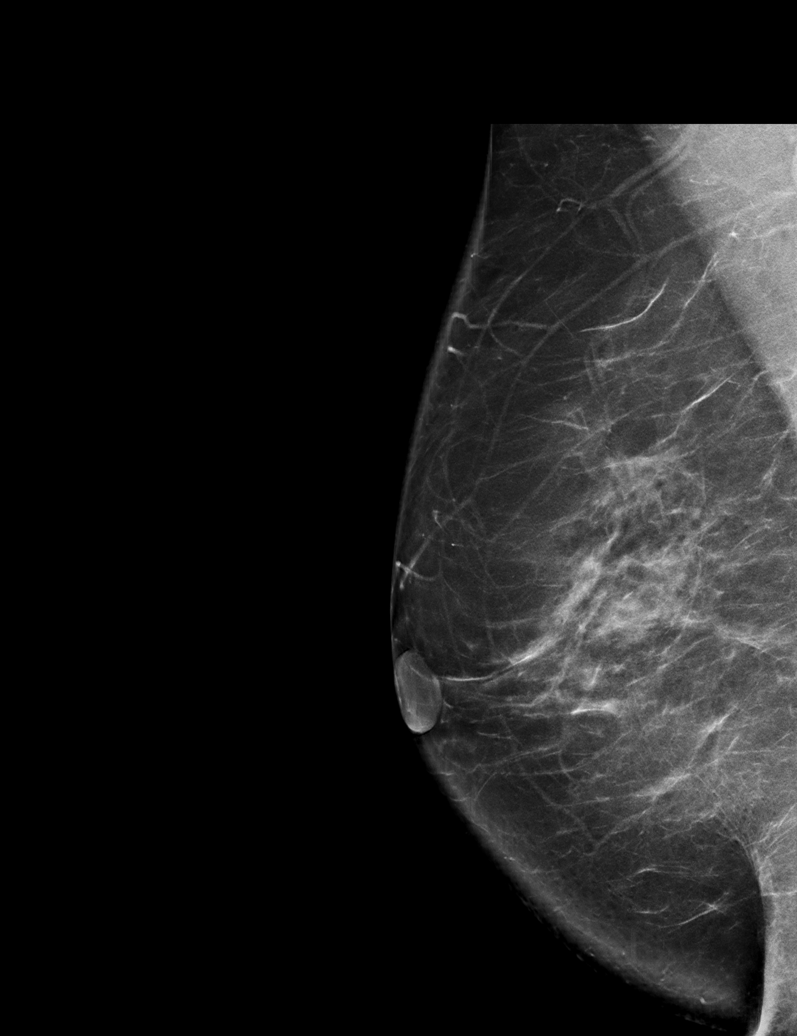

[L CC synth-2D]
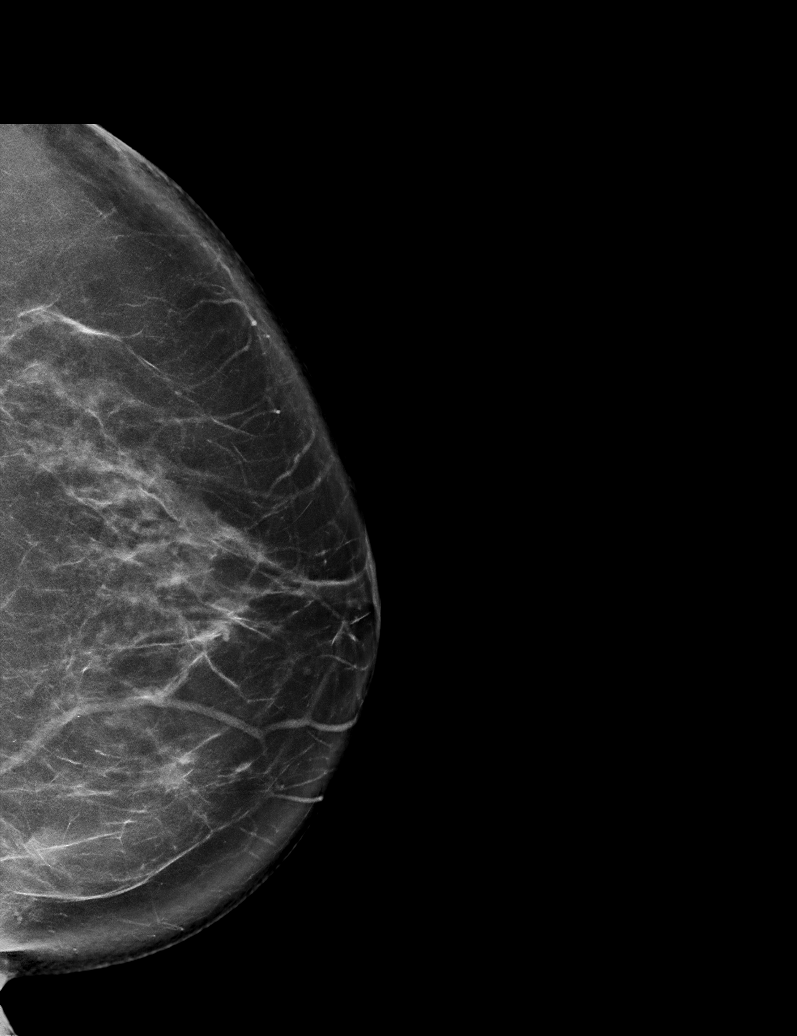

[L MLO synth-2D]
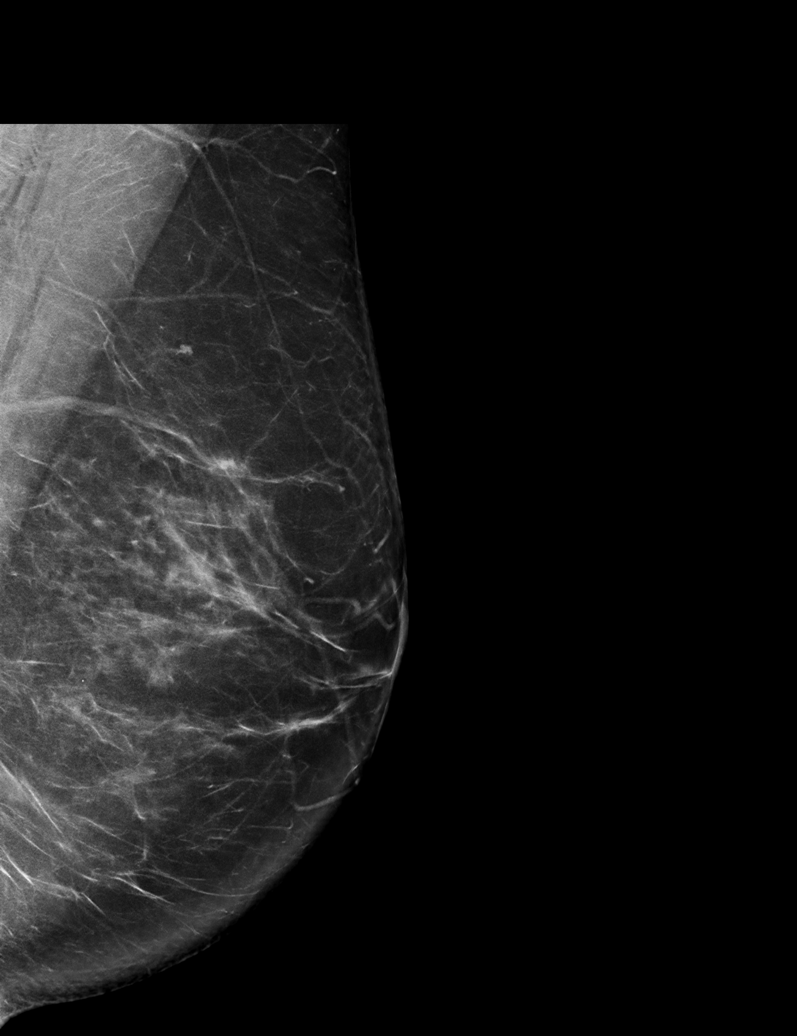

[L CC]
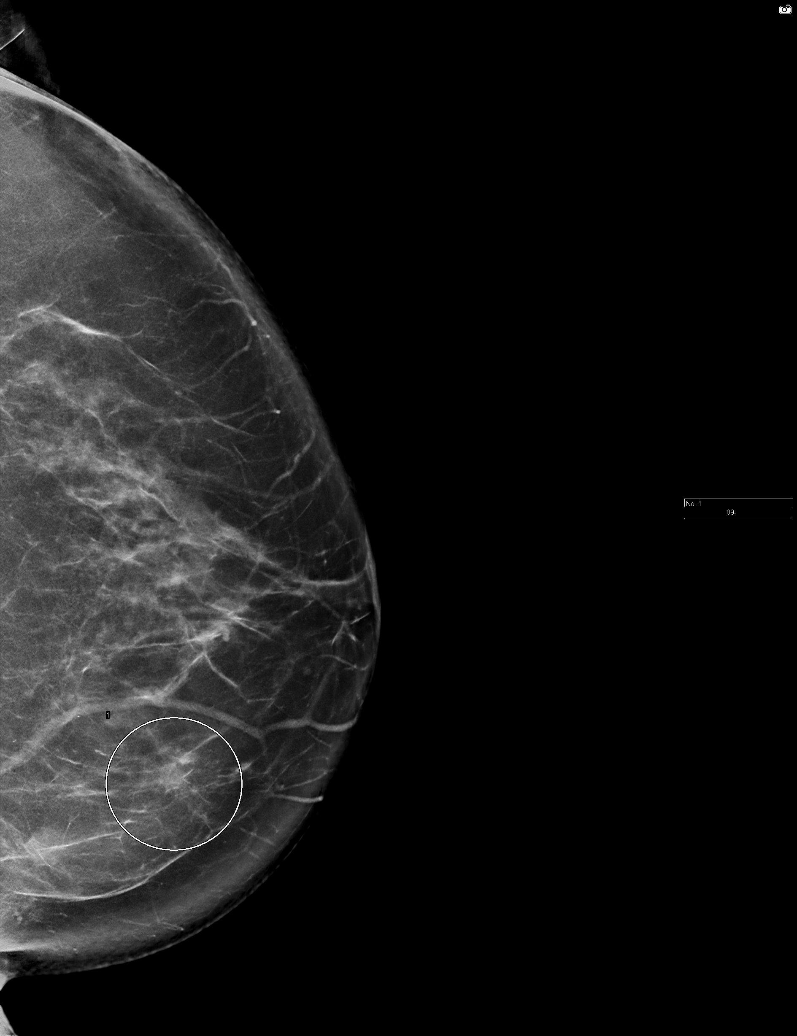

[6 of 31 positions shown; findings below may reference images not displayed]

ACR Breast Density Category b: There are scattered areas of
fibroglandular density.
FINDINGS: In the left breast, a possible asymmetry warrants further
evaluation. In the right breast, no findings suspicious for
malignancy. Images were processed with CAD.
IMPRESSION: Further evaluation is suggested for possible asymmetry in the left
breast.

RECOMMENDATION:
Diagnostic mammogram and possibly ultrasound of the left breast.
(Code:[id number])

The patient will be contacted regarding the findings, and additional
imaging will be scheduled.

BI-RADS CATEGORY  0: Incomplete. Need additional imaging evaluation
and/or prior mammograms for comparison.

## 2020-03-06 ENCOUNTER — Ambulatory Visit: Payer: BC Managed Care – PPO

## 2020-05-20 ENCOUNTER — Ambulatory Visit
Admission: RE | Admit: 2020-05-20 | Discharge: 2020-05-20 | Disposition: A | Discharge status: Home / Self Care | Payer: BC Managed Care – PPO | Source: Ambulatory Visit | Attending: Student | Admitting: Student

## 2020-05-20 ENCOUNTER — Other Ambulatory Visit: Payer: Self-pay | Admitting: Student

## 2020-05-20 DIAGNOSIS — Z1231 Encounter for screening mammogram for malignant neoplasm of breast: Secondary | ICD-10-CM | POA: Diagnosis not present

## 2021-04-27 ENCOUNTER — Other Ambulatory Visit: Payer: Self-pay | Admitting: Student

## 2021-04-27 DIAGNOSIS — Z1231 Encounter for screening mammogram for malignant neoplasm of breast: Secondary | ICD-10-CM

## 2021-05-25 ENCOUNTER — Other Ambulatory Visit: Payer: Self-pay

## 2021-05-25 ENCOUNTER — Ambulatory Visit
Admission: RE | Admit: 2021-05-25 | Discharge: 2021-05-25 | Disposition: A | Discharge status: Home / Self Care | Payer: BC Managed Care – PPO | Source: Ambulatory Visit | Attending: Student | Admitting: Student

## 2021-05-25 DIAGNOSIS — Z1231 Encounter for screening mammogram for malignant neoplasm of breast: Secondary | ICD-10-CM | POA: Diagnosis present

## 2021-05-28 ENCOUNTER — Other Ambulatory Visit: Payer: Self-pay | Admitting: Student

## 2021-05-28 DIAGNOSIS — R928 Other abnormal and inconclusive findings on diagnostic imaging of breast: Secondary | ICD-10-CM

## 2021-06-03 ENCOUNTER — Ambulatory Visit
Admission: RE | Admit: 2021-06-03 | Discharge: 2021-06-03 | Disposition: A | Discharge status: Home / Self Care | Payer: BC Managed Care – PPO | Source: Ambulatory Visit | Attending: Student | Admitting: Student

## 2021-06-03 ENCOUNTER — Other Ambulatory Visit: Payer: Self-pay

## 2021-06-03 DIAGNOSIS — R928 Other abnormal and inconclusive findings on diagnostic imaging of breast: Secondary | ICD-10-CM

## 2021-07-21 ENCOUNTER — Other Ambulatory Visit: Payer: Self-pay | Admitting: Physical Medicine and Rehabilitation

## 2021-07-21 DIAGNOSIS — M5416 Radiculopathy, lumbar region: Secondary | ICD-10-CM

## 2023-04-02 NOTE — Progress Notes (Unsigned)
Cardiology Office Note  Date:  04/04/2023   ID:  SHARNIKA BINNEY, DOB 1961-06-14, MRN [id number]  PCP:  Carren Rang, PA-C   Chief Complaint  Patient presents with   Chest Pain    Patient c/o palpitations, cramping in chest, pounding in chest that radiates into neck and throat and shortness of breath. Medications reviewed by the patient verbally.     HPI:  Ms. Christel Bai is a 62 year old woman with past medical history of Thyroid disease Family history of coronary artery disease/father Hypertension Anxiety Asthma Hyperlipidemia Prediabetes Previously seen by cardiology in 2015 for chest pain Calcium score of 0 in 2019 Who presents by referral from Carren Rang for consultation of her chest discomfort, palpitations  Reports experiencing cramps in chest, rare episodes No rhyme or reason, will come on at rest,  Lasting less than a minute, typically several seconds Not associated with exertion Reports able to exercise on a regular basis  Also having palpitations: random,  Radiating up to throat at times Feels like strong beats  Chronic tired  Recent lab work reviewed Total cholesterol 228 LDL 141  EKG personally reviewed by myself on todays visit Sinus bradycardia rate 59 bpm no significant ST-T wave changes   PMH:   has a past medical history of Allergy, Anemia, Anxiety, Arthritis, Asthma, Hypertension, and Hypothyroidism.  PSH:    Past Surgical History:  Procedure Laterality Date   ANTERIOR CRUCIATE LIGAMENT REPAIR Left    BREAST BIOPSY Left    BUNIONECTOMY Left    MENISCUS REPAIR Left    NASAL SINUS SURGERY     TOTAL VAGINAL HYSTERECTOMY      Current Outpatient Medications  Medication Sig Dispense Refill   albuterol (PROAIR HFA) 108 (90 Base) MCG/ACT inhaler Inhale 2 puffs into the lungs as needed.     B Complex Vitamins (VITAMIN-B COMPLEX PO) Take by mouth daily.     Cholecalciferol (VITAMIN D-3 PO) Take by mouth daily.      dicyclomine (BENTYL) 20 MG tablet Take 20 mg by mouth as needed for spasms.     Glucosamine HCl (GLUCOSAMINE PO) Take by mouth daily.     MAGNESIUM PO Take by mouth daily.     Melatonin 5 MG CAPS Take 2 capsules by mouth at bedtime as needed.     Multiple Vitamins-Minerals (HAIR VITAMINS PO) Take 1 tablet by mouth daily.     multivitamin-lutein (OCUVITE-LUTEIN) CAPS capsule Take 1 capsule by mouth daily.     Omega-3 Fatty Acids (FISH OIL PO) Take by mouth 2 (two) times daily.     thyroid (ARMOUR) 60 MG tablet Take 180 mg by mouth as directed.     venlafaxine XR (EFFEXOR-XR) 37.5 MG 24 hr capsule Take 37.5 mg by mouth daily with breakfast.     No current facility-administered medications for this visit.    Allergies:   Hydrochlorothiazide, Pollen extract, and Acetaminophen   Social History:  The patient  reports that she has never smoked. She has never used smokeless tobacco. She reports current alcohol use. She reports that she does not use drugs.   Family History:   family history includes Arrhythmia in her father; Breast cancer in her maternal aunt; Breast cancer (age of onset: 28) in her mother; Colon cancer in her maternal aunt, mother, and paternal grandfather; Colon polyps in her father; Diabetes in her mother; Diverticulitis in her father; Heart attack in her father; Kidney disease in her mother; Lung cancer in her maternal aunt.  Review of Systems: Review of Systems  Constitutional: Negative.   HENT: Negative.    Respiratory: Negative.    Cardiovascular: Negative.   Gastrointestinal: Negative.   Musculoskeletal: Negative.   Neurological: Negative.   Psychiatric/Behavioral: Negative.    All other systems reviewed and are negative.   PHYSICAL EXAM: VS:  BP 132/80 (BP Location: Right Arm, Patient Position: Sitting, Cuff Size: Normal)   Pulse (!) 59   Ht 5\' 3"  (1.6 m)   Wt 145 lb 4 oz (65.9 kg)   SpO2 95%   BMI 25.73 kg/m  , BMI Body mass index is 25.73 kg/m. GEN: Well  nourished, well developed, in no acute distress HEENT: normal Neck: no JVD, carotid bruits, or masses Cardiac: RRR; no murmurs, rubs, or gallops,no edema  Respiratory:  clear to auscultation bilaterally, normal work of breathing GI: soft, nontender, nondistended, + BS MS: no deformity or atrophy Skin: warm and dry, no rash Neuro:  Strength and sensation are intact Psych: euthymic mood, full affect   Recent Labs: No results found for requested labs within last 365 days.    Lipid Panel No results found for: "CHOL", "HDL", "LDLCALC", "TRIG"    Wt Readings from Last 3 Encounters:  04/04/23 145 lb 4 oz (65.9 kg)  10/03/18 141 lb (64 kg)  09/15/18 141 lb (64 kg)       ASSESSMENT AND PLAN:  Problem List Items Addressed This Visit     Chest pain - Primary   Relevant Orders   EKG 12-Lead   Other Visit Diagnoses     Palpitations       Mixed hyperlipidemia          Chest pain Atypical in nature, around the subxiphoid area, fleeting discomfort, feels like a cramp, coming on at rest Unable to exclude GI etiology, esophageal spasm, hiatal hernia, perhaps less likely gallbladder disease Recommend she try omeprazole 20 mg daily If symptoms persist, could consider CT scan to rule out structural disease/hiatal hernia No further cardiac workup needed at this time given good exercise tolerance  Palpitations Likely having PACs/PVCs Zio monitor offered Prefers not to be on beta-blocker medication he is on as needed or regular basis For worsening symptoms, recommend she consider Zio monitor  Hyperlipidemia Would prefer not to be on medication statin or Zetia Prior calcium scoring approximately 5 years ago with calcium score of 0 Recommend she could consider scanning every 10 years to help guide therapy  Patient seen in consultation for Jacklynn Ganong and will be referred back to her office for ongoing care of the issues detailed above   Signed, Dossie Arbour, M.D., Ph.D. [facility]  [facility] [place], [place] [phone]

## 2023-04-04 ENCOUNTER — Encounter: Payer: Self-pay | Admitting: Cardiovascular Disease

## 2023-04-04 ENCOUNTER — Ambulatory Visit: Payer: BC Managed Care – PPO | Attending: Cardiovascular Disease | Admitting: Cardiovascular Disease

## 2023-04-04 VITALS — BP 132/80 | HR 59 | Ht 63.0 in | Wt 145.2 lb

## 2023-04-04 DIAGNOSIS — R079 Chest pain, unspecified: Secondary | ICD-10-CM

## 2023-04-04 DIAGNOSIS — R002 Palpitations: Secondary | ICD-10-CM | POA: Diagnosis not present

## 2023-04-04 DIAGNOSIS — E782 Mixed hyperlipidemia: Secondary | ICD-10-CM

## 2023-04-04 NOTE — Patient Instructions (Addendum)
Call for zio monitor  Medication Instructions:  No changes  Try OTC omeprazole 20 mg daily for one month  Research Levsin sl  If you need a refill on your cardiac medications before your next appointment, please call your pharmacy.   Lab work: No new labs needed  Testing/Procedures: No new testing needed  Follow-Up: At [facility], you and your health needs are our priority.  As part of our continuing mission to provide you with exceptional heart care, we have created designated Provider Care Teams.  These Care Teams include your primary Cardiologist (physician) and Advanced Practice Providers (APPs -  Physician Assistants and Nurse Practitioners) who all work together to provide you with the care you need, when you need it.  You will need a follow up appointment as needed  Providers on your designated Care Team:   Nicolasa Ducking, NP Eula Listen, PA-C Cadence Fransico Michael, [place]  COVID-19 Vaccine Information can be found at: [email or web address] For questions related to vaccine distribution or appointments, please email [email or web address] or call 4016059890.

## 2023-08-01 ENCOUNTER — Ambulatory Visit
Admission: RE | Admit: 2023-08-01 | Discharge: 2023-08-01 | Disposition: A | Discharge status: Home / Self Care | Payer: BC Managed Care – PPO | Source: Ambulatory Visit | Attending: Student | Admitting: Student

## 2023-08-01 ENCOUNTER — Other Ambulatory Visit: Payer: Self-pay | Admitting: Student

## 2023-08-01 DIAGNOSIS — Z1231 Encounter for screening mammogram for malignant neoplasm of breast: Secondary | ICD-10-CM | POA: Diagnosis present

## 2023-10-24 ENCOUNTER — Encounter: Payer: Self-pay | Admitting: Gastroenterology

## 2023-12-23 ENCOUNTER — Other Ambulatory Visit: Payer: Self-pay | Admitting: Neurology

## 2023-12-23 DIAGNOSIS — G43109 Migraine with aura, not intractable, without status migrainosus: Secondary | ICD-10-CM

## 2023-12-27 ENCOUNTER — Encounter: Payer: Self-pay | Admitting: Neurology

## 2024-01-16 ENCOUNTER — Ambulatory Visit
Admission: RE | Admit: 2024-01-16 | Discharge: 2024-01-16 | Disposition: A | Discharge status: Home / Self Care | Payer: BC Managed Care – PPO | Source: Ambulatory Visit | Attending: Neurology | Admitting: Neurology

## 2024-01-16 DIAGNOSIS — G43109 Migraine with aura, not intractable, without status migrainosus: Secondary | ICD-10-CM

## 2024-01-16 MED ORDER — GADOPICLENOL 0.5 MMOL/ML IV SOLN
7.5000 mL | Freq: Once | INTRAVENOUS | Status: AC | PRN
  Administered 2024-01-16: 7.5 mL via INTRAVENOUS

## 2024-02-22 ENCOUNTER — Other Ambulatory Visit: Payer: Self-pay | Admitting: Orthopedic Surgery

## 2024-02-22 ENCOUNTER — Ambulatory Visit: Attending: Internal Medicine

## 2024-02-22 DIAGNOSIS — M79661 Pain in right lower leg: Secondary | ICD-10-CM

## 2024-02-22 DIAGNOSIS — M7989 Other specified soft tissue disorders: Secondary | ICD-10-CM

## 2024-09-05 ENCOUNTER — Other Ambulatory Visit: Payer: Self-pay | Admitting: Student

## 2024-09-05 DIAGNOSIS — Z1231 Encounter for screening mammogram for malignant neoplasm of breast: Secondary | ICD-10-CM

## 2024-09-26 ENCOUNTER — Ambulatory Visit
Admission: RE | Admit: 2024-09-26 | Discharge: 2024-09-26 | Disposition: A | Discharge status: Home / Self Care | Source: Ambulatory Visit | Attending: Student | Admitting: Student

## 2024-09-26 DIAGNOSIS — Z1231 Encounter for screening mammogram for malignant neoplasm of breast: Secondary | ICD-10-CM | POA: Insufficient documentation

## 2024-10-03 ENCOUNTER — Other Ambulatory Visit: Payer: Self-pay | Admitting: Student

## 2024-10-03 DIAGNOSIS — R928 Other abnormal and inconclusive findings on diagnostic imaging of breast: Secondary | ICD-10-CM

## 2024-10-08 ENCOUNTER — Ambulatory Visit
Admission: RE | Admit: 2024-10-08 | Discharge: 2024-10-08 | Disposition: A | Discharge status: Home / Self Care | Source: Ambulatory Visit | Attending: Student | Admitting: Student

## 2024-10-08 DIAGNOSIS — R928 Other abnormal and inconclusive findings on diagnostic imaging of breast: Secondary | ICD-10-CM | POA: Insufficient documentation

## 2024-12-10 ENCOUNTER — Encounter
Admission: RE | Disposition: A | Discharge status: Home / Self Care | Payer: Self-pay | Source: Home / Self Care | Attending: Gastroenterology

## 2024-12-10 ENCOUNTER — Ambulatory Visit: Admitting: Certified Registered Nurse Anesthetist

## 2024-12-10 ENCOUNTER — Ambulatory Visit
Admission: RE | Admit: 2024-12-10 | Discharge: 2024-12-10 | Disposition: A | Discharge status: Home / Self Care | Attending: Gastroenterology | Admitting: Gastroenterology

## 2024-12-10 DIAGNOSIS — I10 Essential (primary) hypertension: Secondary | ICD-10-CM | POA: Diagnosis not present

## 2024-12-10 DIAGNOSIS — Z90710 Acquired absence of both cervix and uterus: Secondary | ICD-10-CM | POA: Diagnosis not present

## 2024-12-10 DIAGNOSIS — Z79899 Other long term (current) drug therapy: Secondary | ICD-10-CM | POA: Insufficient documentation

## 2024-12-10 DIAGNOSIS — Z800 Family history of malignant neoplasm of digestive organs: Secondary | ICD-10-CM | POA: Insufficient documentation

## 2024-12-10 DIAGNOSIS — Z1211 Encounter for screening for malignant neoplasm of colon: Secondary | ICD-10-CM | POA: Diagnosis present

## 2024-12-10 DIAGNOSIS — K640 First degree hemorrhoids: Secondary | ICD-10-CM | POA: Diagnosis not present

## 2024-12-10 DIAGNOSIS — E039 Hypothyroidism, unspecified: Secondary | ICD-10-CM | POA: Insufficient documentation

## 2024-12-10 DIAGNOSIS — J45909 Unspecified asthma, uncomplicated: Secondary | ICD-10-CM | POA: Insufficient documentation

## 2024-12-10 DIAGNOSIS — K5730 Diverticulosis of large intestine without perforation or abscess without bleeding: Secondary | ICD-10-CM | POA: Diagnosis not present

## 2024-12-10 DIAGNOSIS — M1990 Unspecified osteoarthritis, unspecified site: Secondary | ICD-10-CM | POA: Insufficient documentation

## 2024-12-10 HISTORY — PX: COLONOSCOPY: SHX5424

## 2024-12-10 MED ORDER — LIDOCAINE HCL (CARDIAC) PF 100 MG/5ML IV SOSY
PREFILLED_SYRINGE | INTRAVENOUS | Status: DC | PRN
  Administered 2024-12-10: 50 mg via INTRAVENOUS

## 2024-12-10 MED ORDER — PROPOFOL 500 MG/50ML IV EMUL
INTRAVENOUS | Status: DC | PRN
  Administered 2024-12-10: 100 mg via INTRAVENOUS
  Administered 2024-12-10: 125 ug/kg/min via INTRAVENOUS

## 2024-12-10 MED ORDER — SODIUM CHLORIDE 0.9 % IV SOLN: INTRAVENOUS | Status: DC

## 2024-12-10 MED ORDER — DEXMEDETOMIDINE HCL IN NACL 80 MCG/20ML IV SOLN
INTRAVENOUS | Status: DC | PRN
  Administered 2024-12-10: 8 ug via INTRAVENOUS

## 2024-12-10 NOTE — Anesthesia Preprocedure Evaluation (Signed)
"                                    Anesthesia Evaluation  Patient identified by MRN, date of birth, ID band Patient awake    Reviewed: Allergy & Precautions, NPO status , Patient's Chart, lab work & pertinent test results  Airway Mallampati: III  TM Distance: <3 FB Neck ROM: full    Dental  (+) Teeth Intact   Pulmonary neg pulmonary ROS, asthma    Pulmonary exam normal breath sounds clear to auscultation       Cardiovascular Exercise Tolerance: Good hypertension, Pt. on medications negative cardio ROS Normal cardiovascular exam Rhythm:Regular Rate:Normal     Neuro/Psych negative neurological ROS  negative psych ROS   GI/Hepatic negative GI ROS, Neg liver ROS,,,  Endo/Other  negative endocrine ROSHypothyroidism    Renal/GU negative Renal ROS  negative genitourinary   Musculoskeletal  (+) Arthritis ,    Abdominal Normal abdominal exam  (+)   Peds negative pediatric ROS (+)  Hematology negative hematology ROS (+) Blood dyscrasia, anemia   Anesthesia Other Findings Past Medical History: No date: Allergy No date: Anemia No date: Anxiety No date: Arthritis No date: Asthma No date: Hypertension No date: Hypothyroidism  Past Surgical History: No date: ANTERIOR CRUCIATE LIGAMENT REPAIR; Left No date: BREAST BIOPSY; Left No date: BUNIONECTOMY; Left No date: MENISCUS REPAIR; Left No date: NASAL SINUS SURGERY No date: TOTAL VAGINAL HYSTERECTOMY     Reproductive/Obstetrics negative OB ROS                              Anesthesia Physical Anesthesia Plan  ASA: 2  Anesthesia Plan: General   Post-op Pain Management:    Induction:   PONV Risk Score and Plan: Propofol  infusion and TIVA  Airway Management Planned: Natural Airway and Nasal Cannula  Additional Equipment:   Intra-op Plan:   Post-operative Plan:   Informed Consent: I have reviewed the patients History and Physical, chart, labs and discussed the  procedure including the risks, benefits and alternatives for the proposed anesthesia with the patient or authorized representative who has indicated his/her understanding and acceptance.     Dental Advisory Given  Plan Discussed with: CRNA  Anesthesia Plan Comments:         Anesthesia Quick Evaluation  "

## 2024-12-10 NOTE — Anesthesia Postprocedure Evaluation (Signed)
"   Anesthesia Post Note  Patient: Katherine Braun  Procedure(s) Performed: COLONOSCOPY  Patient location during evaluation: PACU Anesthesia Type: General Level of consciousness: awake Pain management: satisfactory to patient Vital Signs Assessment: post-procedure vital signs reviewed and stable Respiratory status: spontaneous breathing Cardiovascular status: stable Anesthetic complications: no   No notable events documented.   Last Vitals:  Vitals:   12/10/24 1035 12/10/24 1048  BP: 103/68 118/68  Pulse: 76 65  Resp: 17 13  Temp:    SpO2: (!) 86% 96%    Last Pain:  Vitals:   12/10/24 1025  TempSrc:   PainSc: 0-No pain                 VAN STAVEREN,Logan Vegh      "

## 2024-12-10 NOTE — Anesthesia Procedure Notes (Signed)
 Date/Time: 12/10/2024 9:59 AM  Performed by: Dominica Krabbe, CRNAPre-anesthesia Checklist: Patient identified, Emergency Drugs available, Suction available, Patient being monitored and Timeout performed Patient Re-evaluated:Patient Re-evaluated prior to induction Oxygen Delivery Method: Nasal cannula Preoxygenation: Pre-oxygenation with 100% oxygen Induction Type: IV induction

## 2024-12-10 NOTE — Transfer of Care (Signed)
 Immediate Anesthesia Transfer of Care Note  Patient: Katherine Braun  Procedure(s) Performed: COLONOSCOPY  Patient Location: Endoscopy Unit  Anesthesia Type:General  Level of Consciousness: sedated, drowsy, and patient cooperative  Airway & Oxygen Therapy: Patient Spontanous Breathing  Post-op Assessment: Report given to RN and Post -op Vital signs reviewed and stable  Post vital signs: Reviewed and stable  Last Vitals:  Vitals Value Taken Time  BP 93/57 12/10/24 10:26  Temp    Pulse 65 12/10/24 10:26  Resp 18 12/10/24 10:26  SpO2 93 % 12/10/24 10:26    Last Pain:  Vitals:   12/10/24 1025  TempSrc:   PainSc: 0-No pain         Complications: No notable events documented.

## 2024-12-10 NOTE — Interval H&P Note (Signed)
 History and Physical Interval Note:  12/10/2024 9:55 AM  Katherine Braun  has presented today for surgery, with the diagnosis of Colon cancer screening (Z12.11) Family hx colonic polyps (Z83.719) Family history of colon cancer (Z80.0).  The various methods of treatment have been discussed with the patient and family. After consideration of risks, benefits and other options for treatment, the patient has consented to  Procedures: COLONOSCOPY (N/A) as a surgical intervention.  The patient's history has been reviewed, patient examined, no change in status, stable for surgery.  I have reviewed the patient's chart and labs.  Questions were answered to the patient's satisfaction.     Katherine Braun  Ok to proceed with colonoscopy

## 2024-12-10 NOTE — Op Note (Signed)
 [facility] Patient Name: Katherine Braun Procedure Date: 12/10/2024 9:55 AM MRN: [id number] Account #: [id number] Date of Birth: Feb 28, 1961 Admit Type: Outpatient Age: 64 Room: [facility] ENDO ROOM 3 Gender: Female Note Status: Finalized Instrument Name: Colon Scope [id number] Procedure:             Colonoscopy Indications:           Colon cancer screening in patient at increased risk:                         Family history of 1st-degree relative with colon                         polyps, Screening in patient at increased risk: Family                         history of 1st-degree relative with colorectal cancer                         before age 28 years Providers:             Ole Schick MD, MD Referring MD:          No Local Md, MD (Referring MD) Medicines:             Monitored Anesthesia Care Complications:         No immediate complications. Procedure:             Pre-Anesthesia Assessment:                        - Prior to the procedure, a History and Physical was                         performed, and patient medications and allergies were                         reviewed. The patient is competent. The risks and                         benefits of the procedure and the sedation options and                         risks were discussed with the patient. All questions                         were answered and informed consent was obtained.                         Patient identification and proposed procedure were                         verified by the physician, the nurse, the                         anesthesiologist, the anesthetist and the technician                         in the endoscopy suite. Mental Status Examination:  alert and oriented. Airway Examination: normal                         oropharyngeal airway and neck mobility. Respiratory                         Examination: clear to auscultation. CV  Examination:                         normal. Prophylactic Antibiotics: The patient does not                         require prophylactic antibiotics. Prior                         Anticoagulants: The patient has taken no anticoagulant                         or antiplatelet agents. ASA Grade Assessment: II - A                         patient with mild systemic disease. After reviewing                         the risks and benefits, the patient was deemed in                         satisfactory condition to undergo the procedure. The                         anesthesia plan was to use monitored anesthesia care                         (MAC). Immediately prior to administration of                         medications, the patient was re-assessed for adequacy                         to receive sedatives. The heart rate, respiratory                         rate, oxygen saturations, blood pressure, adequacy of                         pulmonary ventilation, and response to care were                         monitored throughout the procedure. The physical                         status of the patient was re-assessed after the                         procedure.                        After obtaining informed consent, the colonoscope was  passed under direct vision. Throughout the procedure,                         the patient's blood pressure, pulse, and oxygen                         saturations were monitored continuously. The                         Colonoscope was introduced through the anus and                         advanced to the the terminal ileum, with                         identification of the appendiceal orifice and IC                         valve. The colonoscopy was performed without                         difficulty. The patient tolerated the procedure well.                         The quality of the bowel preparation was good. The                          terminal ileum, ileocecal valve, appendiceal orifice,                         and rectum were photographed. Findings:      The perianal and digital rectal examinations were normal.      The terminal ileum appeared normal.      A few small-mouthed diverticula were found in the transverse colon.      Internal hemorrhoids were found during retroflexion. The hemorrhoids       were Grade I (internal hemorrhoids that do not prolapse).      The exam was otherwise without abnormality on direct and retroflexion       views. Impression:            - The examined portion of the ileum was normal.                        - Diverticulosis in the transverse colon.                        - Internal hemorrhoids.                        - The examination was otherwise normal on direct and                         retroflexion views.                        - No specimens collected. Recommendation:        - Discharge patient to home.                        -  Resume previous diet.                        - Continue present medications.                        - Repeat colonoscopy in 5 years for screening purposes.                        - Return to referring physician as previously                         scheduled. Procedure Code(s):     --- Professional ---                        H9894, Colorectal cancer screening; colonoscopy on                         individual at high risk Diagnosis Code(s):     --- Professional ---                        Z83.71, Family history of colonic polyps                        Z80.0, Family history of malignant neoplasm of                         digestive organs                        K64.0, First degree hemorrhoids                        K57.30, Diverticulosis of large intestine without                         perforation or abscess without bleeding CPT copyright 2022 [employer]. All rights reserved. The codes documented in this report are preliminary and  upon coder review may  be revised to meet current compliance requirements. Ole Schick MD, MD 12/10/2024 10:25:29 AM Number of Addenda: 0 Note Initiated On: 12/10/2024 9:55 AM Scope Withdrawal Time: 0 hours 8 minutes 11 seconds  Total Procedure Duration: 0 hours 14 minutes 40 seconds  Estimated Blood Loss:  Estimated blood loss: none.      [facility]

## 2024-12-10 NOTE — H&P (Signed)
 Outpatient short stay form Pre-procedure 12/10/2024  Ole ONEIDA Schick, MD  Primary Physician: Franchot Houston, PA-C  Reason for visit:  High risk screening  History of present illness:    64 y/o lady with history of hypothyroidism, hypertension, and arthritis here for high risk screening. Last colonoscopy in 2019 was unremarkable. No blood thinners. Mother had colon cancer in her 75's and father with history of polyps. History of hysterectomy.   Current Medications[1]  Medications Prior to Admission  Medication Sig Dispense Refill Last Dose/Taking   albuterol (PROAIR HFA) 108 (90 Base) MCG/ACT inhaler Inhale 2 puffs into the lungs as needed.   Past Month   thyroid (ARMOUR) 60 MG tablet Take 180 mg by mouth as directed.   12/09/2024   B Complex Vitamins (VITAMIN-B COMPLEX PO) Take by mouth daily.      Cholecalciferol (VITAMIN D-3 PO) Take by mouth daily.      dicyclomine (BENTYL) 20 MG tablet Take 20 mg by mouth as needed for spasms.      Glucosamine HCl (GLUCOSAMINE PO) Take by mouth daily.      MAGNESIUM PO Take by mouth daily.      Melatonin 5 MG CAPS Take 2 capsules by mouth at bedtime as needed.      Multiple Vitamins-Minerals (HAIR VITAMINS PO) Take 1 tablet by mouth daily.      multivitamin-lutein (OCUVITE-LUTEIN) CAPS capsule Take 1 capsule by mouth daily.      Omega-3 Fatty Acids (FISH OIL PO) Take by mouth 2 (two) times daily.      venlafaxine XR (EFFEXOR-XR) 37.5 MG 24 hr capsule Take 37.5 mg by mouth daily with breakfast.        Allergies[2]   Past Medical History:  Diagnosis Date   Allergy    Anemia    Anxiety    Arthritis    Asthma    Hypertension    Hypothyroidism     Review of systems:  Otherwise negative.    Physical Exam  Gen: Alert, oriented. Appears stated age.  HEENT: PERRLA. Lungs: No respiratory distress CV: RRR Abd: soft, benign, no masses Ext: No edema    Planned procedures: Proceed with colonoscopy. The patient understands the nature  of the planned procedure, indications, risks, alternatives and potential complications including but not limited to bleeding, infection, perforation, damage to internal organs and possible oversedation/side effects from anesthesia. The patient agrees and gives consent to proceed.  Please refer to procedure notes for findings, recommendations and patient disposition/instructions.     Ole ONEIDA Schick, MD [facility]         [1]  Current Facility-Administered Medications:    0.9 %  sodium chloride  infusion, , Intravenous, Continuous, Eleanor Gatliff, Ole ONEIDA, MD, Last Rate: 20 mL/hr at 12/10/24 9071, Restarted at 12/10/24 0947 [2]  Allergies Allergen Reactions   Hydrochlorothiazide Other (See Comments)   Pollen Extract Other (See Comments)   Acetaminophen Other (See Comments)    Flu like sx's  Flu like symptoms
